# Patient Record
Sex: Male | Born: 1985 | Race: Black or African American | Hispanic: No | Marital: Single | Smoking: Never smoker
Health system: Southern US, Community
[De-identification: ages and names within clinical notes are randomized; demographics above are authoritative.]

---

## 2017-05-13 ENCOUNTER — Emergency Department (HOSPITAL_COMMUNITY)
Admission: EM | Admit: 2017-05-13 | Discharge: 2017-05-13 | Disposition: A | Discharge status: Home / Self Care | Payer: BC Managed Care – PPO | Attending: Emergency Medicine | Admitting: Emergency Medicine

## 2017-05-13 ENCOUNTER — Emergency Department (HOSPITAL_COMMUNITY): Payer: BC Managed Care – PPO

## 2017-05-13 ENCOUNTER — Encounter (HOSPITAL_COMMUNITY): Payer: Self-pay | Admitting: Emergency Medicine

## 2017-05-13 DIAGNOSIS — W109XXA Fall (on) (from) unspecified stairs and steps, initial encounter: Secondary | ICD-10-CM | POA: Diagnosis not present

## 2017-05-13 DIAGNOSIS — S4992XA Unspecified injury of left shoulder and upper arm, initial encounter: Secondary | ICD-10-CM | POA: Diagnosis present

## 2017-05-13 DIAGNOSIS — Y999 Unspecified external cause status: Secondary | ICD-10-CM | POA: Diagnosis not present

## 2017-05-13 DIAGNOSIS — Y929 Unspecified place or not applicable: Secondary | ICD-10-CM | POA: Insufficient documentation

## 2017-05-13 DIAGNOSIS — Y939 Activity, unspecified: Secondary | ICD-10-CM | POA: Diagnosis not present

## 2017-05-13 DIAGNOSIS — W108XXA Fall (on) (from) other stairs and steps, initial encounter: Secondary | ICD-10-CM

## 2017-05-13 DIAGNOSIS — S43005A Unspecified dislocation of left shoulder joint, initial encounter: Secondary | ICD-10-CM

## 2017-05-13 NOTE — Discharge Instructions (Signed)
Wear the sling. Take ibuprofen or naproxen as needed for pain.

## 2017-05-13 NOTE — ED Provider Notes (Signed)
[facility]-EMERGENCY DEPT Provider Note   CSN: [id number] Arrival date & time: 05/13/17  0246     History   Chief Complaint Chief Complaint  Patient presents with  . Shoulder Injury    HPI Joshua Fitzpatrick is a 31 y.o. male.  The history is provided by the patient.  He fell down some steps and injured his left shoulder. Pain is severe and he rates it at 10/10. He denies other injury.  History reviewed. No pertinent past medical history.  There are no active problems to display for this patient.   History reviewed. No pertinent surgical history.     Home Medications    Prior to Admission medications   Not on File    Family History History reviewed. No pertinent family history.  Social History Social History  Substance Use Topics  . Smoking status: Never Smoker  . Smokeless tobacco: Never Used  . Alcohol use No     Allergies   Patient has no known allergies.   Review of Systems Review of Systems  All other systems reviewed and are negative.    Physical Exam Updated Vital Signs BP (!) 136/98 (BP Location: Right Arm)   Pulse 71   Temp 97.8 F (36.6 C) (Oral)   Resp 16   Ht 5\' 9"  (1.753 m)   Wt 64 kg (141 lb)   SpO2 100%   BMI 20.82 kg/m   Physical Exam  Nursing note and vitals reviewed.  31 year old male, resting comfortably and in no acute distress. Vital signs are significant for hypertension. Oxygen saturation is 100%, which is normal. Head is normocephalic and atraumatic. PERRLA, EOMI. Oropharynx is clear. Neck is nontender and supple without adenopathy or JVD. Back is nontender and there is no CVA tenderness. Lungs are clear without rales, wheezes, or rhonchi. Chest is nontender. Heart has regular rate and rhythm without murmur. Abdomen is soft, flat, nontender without masses or hepatosplenomegaly and peristalsis is normoactive. Extremities: Deformity present left shoulder consistent with anterior dislocation. Distal neurovascular exam is  intact with strong pulses, prompt capillary refill, normal sensation. Skin is warm and dry without rash. Neurologic: Mental status is normal, cranial nerves are intact, there are no motor or sensory deficits.  ED Treatments / Results  Labs (all labs ordered are listed, but only abnormal results are displayed) Labs Reviewed - No data to display  EKG  EKG Interpretation None       Radiology No results found.  Procedures Procedures (including critical care time) Reduction of dislocation Date/Time: 3:04 AM Performed by: XLKGM,WNUUVGLICK,Ellamae Lybeck Authorized by: OZDGU,YQIHKGLICK,Daphane Odekirk Consent: Verbal consent obtained. Risks and benefits: risks, benefits and alternatives were discussed Consent given by: patient Required items: required blood products, implants, devices, and special equipment available Time out: Immediately prior to procedure a "time out" was called to verify the correct patient, procedure, equipment, support staff and site/side marked as required.  Patient sedated: no  Vitals: Vital signs were monitored during sedation. Patient tolerance: Patient tolerated the procedure well with no immediate complications. Joint: Left shoulder Reduction technique: Scapular manipulation  Reduction was done prior to x-ray being obtained. He is sent for postreduction x-ray.     Medications Ordered in ED Medications - No data to display   Initial Impression / Assessment and Plan / ED Course  I have reviewed the triage vital signs and the nursing notes.  Pertinent imaging results that were available during my care of the patient were reviewed by me and considered in my medical decision  making (see chart for details).  Fall with dislocation of left shoulder. Shoulder reduction is achieved with scapular manipulation. Following joint reduction, pain is greatly improved. Shoulder is able to be put through a full passive range of motion without pain. Old records are reviewed, and he has no relevant past  visits.  Post-reduction x-ray shows good reduction, no fracture. He is placed in a sling and discharged. He is referred to orthopedics for follow-up.  Final Clinical Impressions(s) / ED Diagnoses   Final diagnoses:  Fall down steps, initial encounter  Closed dislocation of left shoulder, initial encounter    New Prescriptions New Prescriptions   No medications on file     Dione Booze, MD 05/13/17 [phone]

## 2017-05-13 NOTE — ED Triage Notes (Signed)
Pt reports that he fell and landed on left shoulder at appx 2200 on 05/12/17. Obvious deformity noted to left shoulder.

## 2018-07-30 IMAGING — CR DG SHOULDER 2+V*L*
3 series · 3 of 3 positions shown · non-contrast
Comparison: None.

CLINICAL DATA: Status post fall down steps, with left shoulder
dislocation. Initial encounter.

EXAM:
LEFT SHOULDER - 2+ VIEW

[w shoulder external left]
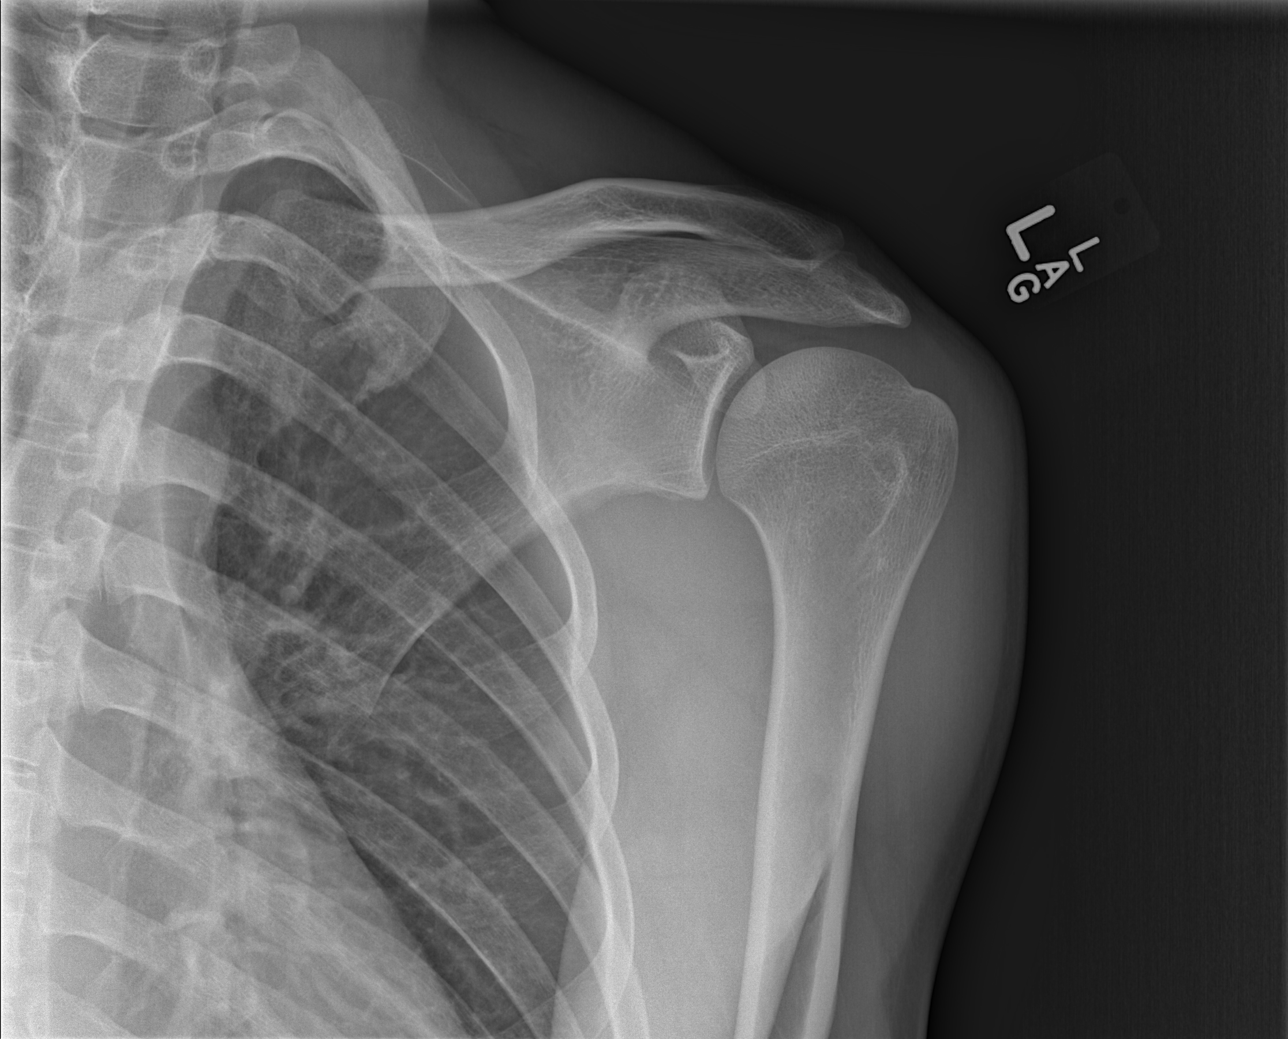

[w shoulder y-view left (1 of 2)]
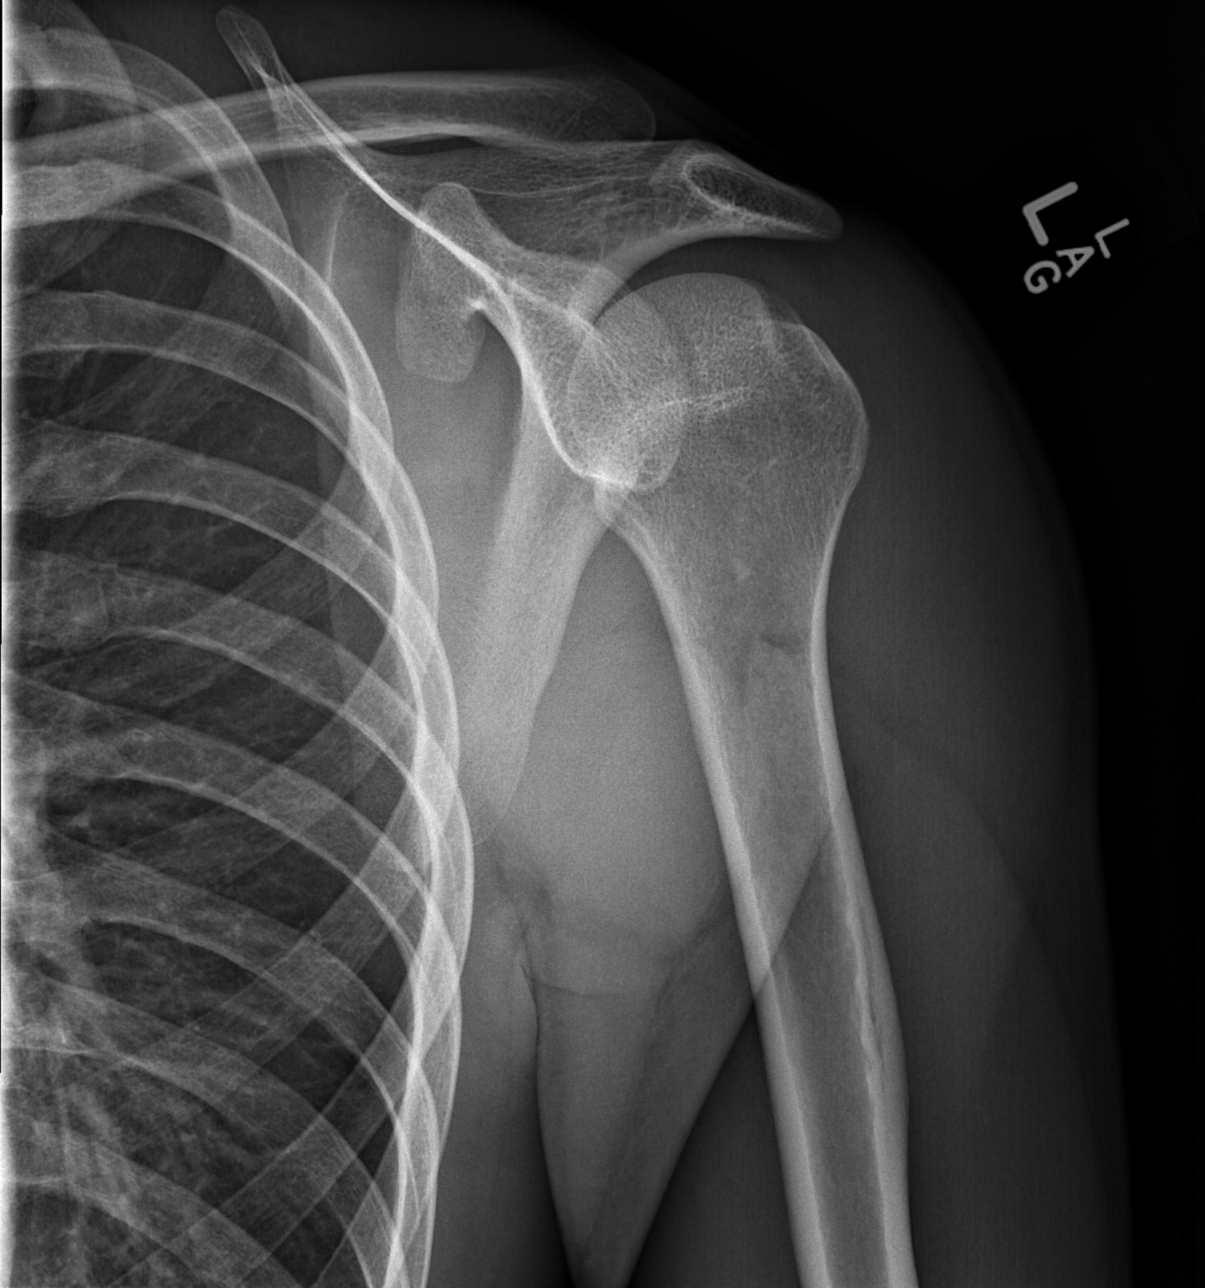

[w shoulder y-view left (2 of 2)]
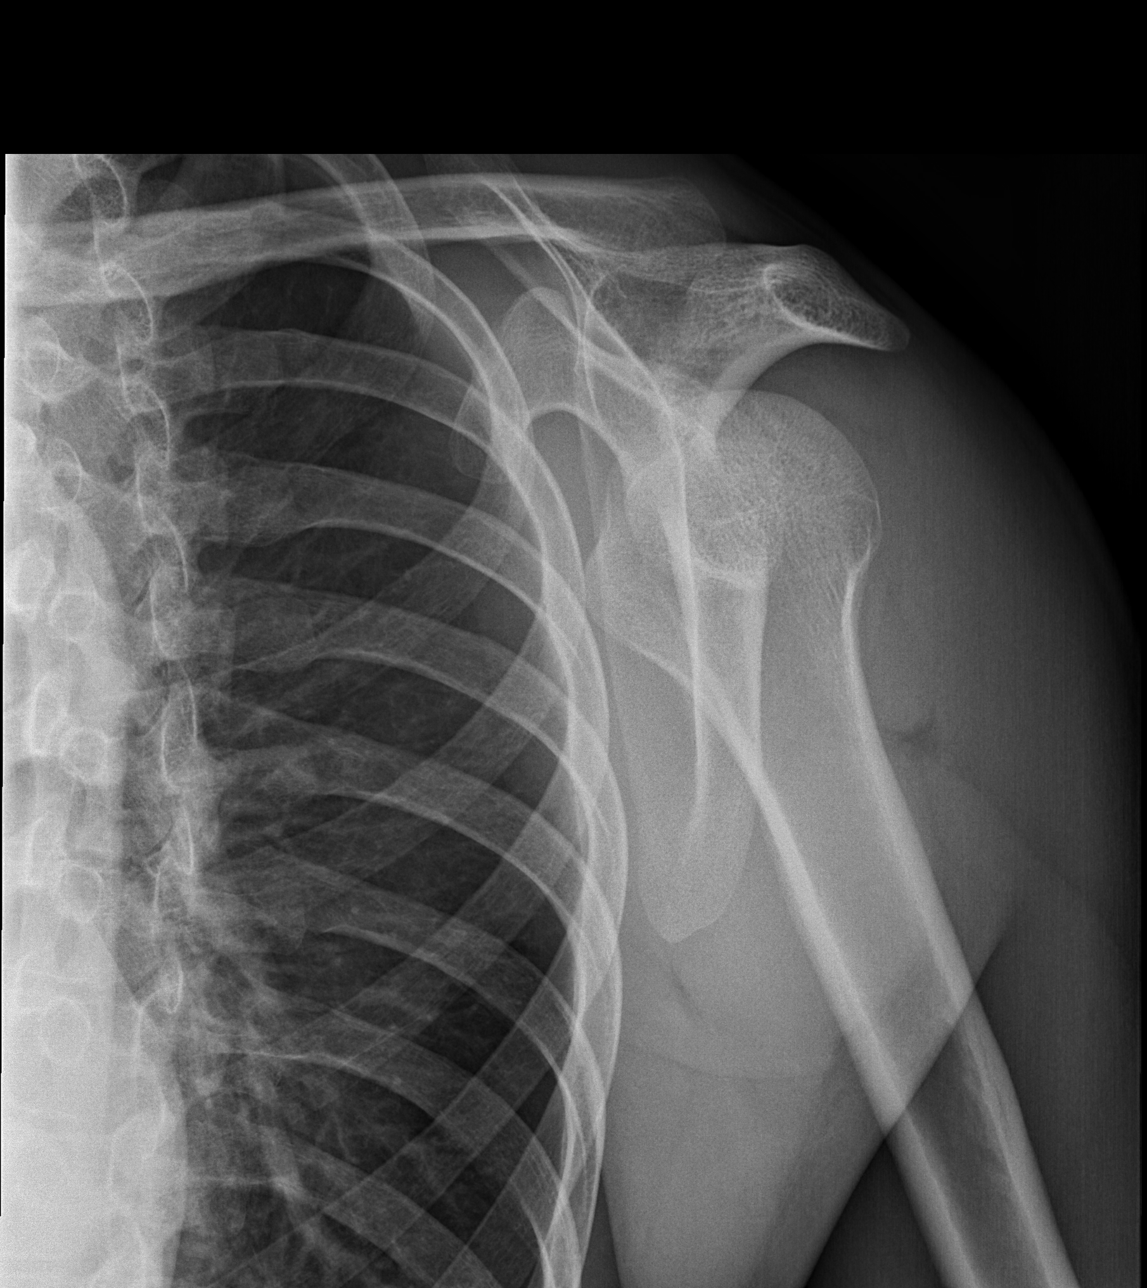

[3 of 3 positions shown; findings below may reference images not displayed]

FINDINGS: There is no evidence of fracture or dislocation. The left humeral
head is seated within the glenoid fossa. The acromioclavicular joint
is unremarkable in appearance. No significant soft tissue
abnormalities are seen. The visualized portions of the left lung are
clear.
IMPRESSION: No evidence of fracture or dislocation.
